# Patient Record
Sex: Male | Born: 2000 | Race: Black or African American | Hispanic: No | Marital: Single | Smoking: Never smoker
Health system: Southern US, Community
[De-identification: ages and names within clinical notes are randomized; demographics above are authoritative.]

## PROBLEM LIST (undated history)

## (undated) ENCOUNTER — Ambulatory Visit: Admission: EM | Payer: BC Managed Care – PPO | Source: Home / Self Care

## (undated) DIAGNOSIS — R569 Unspecified convulsions: Secondary | ICD-10-CM

---

## 2020-09-08 ENCOUNTER — Other Ambulatory Visit: Payer: Self-pay

## 2020-09-08 ENCOUNTER — Encounter (HOSPITAL_COMMUNITY): Payer: Self-pay

## 2020-09-08 ENCOUNTER — Emergency Department (HOSPITAL_COMMUNITY): Payer: BC Managed Care – PPO

## 2020-09-08 ENCOUNTER — Emergency Department (HOSPITAL_COMMUNITY)
Admission: EM | Admit: 2020-09-08 | Discharge: 2020-09-08 | Disposition: A | Discharge status: Home / Self Care | Payer: BC Managed Care – PPO | Attending: Emergency Medicine | Admitting: Emergency Medicine

## 2020-09-08 DIAGNOSIS — R569 Unspecified convulsions: Secondary | ICD-10-CM | POA: Diagnosis not present

## 2020-09-08 LAB — COMPREHENSIVE METABOLIC PANEL
ALT: 18 U/L (ref 0–44)
AST: 19 U/L (ref 15–41)
Albumin: 4.5 g/dL (ref 3.5–5.0)
Alkaline Phosphatase: 34 U/L — ABNORMAL LOW (ref 38–126)
Anion gap: 9 (ref 5–15)
BUN: 20 mg/dL (ref 6–20)
CO2: 24 mmol/L (ref 22–32)
Calcium: 9.5 mg/dL (ref 8.9–10.3)
Chloride: 106 mmol/L (ref 98–111)
Creatinine, Ser: 1.14 mg/dL (ref 0.61–1.24)
GFR, Estimated: >60 mL/min (ref >60)
Glucose, Bld: 93 mg/dL (ref 70–99)
Potassium: 4.5 mmol/L (ref 3.5–5.1)
Sodium: 139 mmol/L (ref 135–145)
Total Bilirubin: 0.7 mg/dL (ref 0.3–1.2)
Total Protein: 7.9 g/dL (ref 6.5–8.1)

## 2020-09-08 LAB — RAPID URINE DRUG SCREEN, HOSP PERFORMED
Amphetamines: NOT DETECTED
Barbiturates: NOT DETECTED
Benzodiazepines: NOT DETECTED
Cocaine: NOT DETECTED
Opiates: NOT DETECTED
Tetrahydrocannabinol: NOT DETECTED

## 2020-09-08 LAB — CBC WITH DIFFERENTIAL/PLATELET
Abs Immature Granulocytes: 0.02 10*3/uL (ref 0.00–0.07)
Basophils Absolute: 0 10*3/uL (ref 0.0–0.1)
Basophils Relative: 0 %
Eosinophils Absolute: 0 10*3/uL (ref 0.0–0.5)
Eosinophils Relative: 0 %
HCT: 45.4 % (ref 39.0–52.0)
Hemoglobin: 15.1 g/dL (ref 13.0–17.0)
Immature Granulocytes: 0 %
Lymphocytes Relative: 10 %
Lymphs Abs: 0.8 10*3/uL (ref 0.7–4.0)
MCH: 27.9 pg (ref 26.0–34.0)
MCHC: 33.3 g/dL (ref 30.0–36.0)
MCV: 83.8 fL (ref 80.0–100.0)
Monocytes Absolute: 0.4 10*3/uL (ref 0.1–1.0)
Monocytes Relative: 5 %
Neutro Abs: 6.8 10*3/uL (ref 1.7–7.7)
Neutrophils Relative %: 85 %
Platelets: 353 10*3/uL (ref 150–400)
RBC: 5.42 MIL/uL (ref 4.22–5.81)
RDW: 13.2 % (ref 11.5–15.5)
WBC: 8.1 10*3/uL (ref 4.0–10.5)
nRBC: 0 % (ref 0.0–0.2)

## 2020-09-08 LAB — URINALYSIS, ROUTINE W REFLEX MICROSCOPIC
Bacteria, UA: NONE SEEN
Bilirubin Urine: NEGATIVE
Glucose, UA: NEGATIVE mg/dL
Ketones, ur: NEGATIVE mg/dL
Nitrite: NEGATIVE
Specific Gravity, Urine: 1.02 (ref 1.005–1.030)
pH: 7 (ref 5.0–8.0)

## 2020-09-08 LAB — ETHANOL: Alcohol, Ethyl (B): <10 mg/dL (ref <10)

## 2020-09-08 NOTE — Discharge Instructions (Addendum)
No driving for next 6 months in light of new onset seizure. No operating heavy machinery. Avoid swimming pools until seizures better managed and evaluated by neurology.

## 2020-09-08 NOTE — ED Provider Notes (Signed)
[facility]-EMERGENCY DEPT Provider Note   CSN: [id number] Arrival date & time: 09/08/20  9030     History Chief Complaint  Patient presents with   Seizure-Like Activity    Clifford Hansen is a 20 y.o. male.  20 year old male with history as below presented the ER for concern for possible seizure-like activity.  This occurred approximately 1 hour prior to arrival.  Normal state of health prior to this event.  Per girlfriend patient with erratic, jerking body movements lasting approximately 20 seconds.  Confused following the event, postictal.  Believes he did bite the cheek on the right side.  No incontinence.  No history of prior seizures.  No daily alcohol use.  No illicit drug use.  No history of seizures.  Patient has returned to his baseline at this time.  No recent dietary or medication changes  The history is provided by the patient. No language interpreter was used.      History reviewed. No pertinent past medical history.  There are no problems to display for this patient.   History reviewed. No pertinent surgical history.     History reviewed. No pertinent family history.     Home Medications Prior to Admission medications   Medication Sig Start Date End Date Taking? Authorizing Provider  sertraline (ZOLOFT) 25 MG tablet Take 25 mg by mouth at bedtime. 08/13/20   [provider]    Allergies    Patient has no known allergies.  Review of Systems   Review of Systems  Constitutional:  Negative for chills and fever.  HENT:  Negative for facial swelling and trouble swallowing.   Eyes:  Negative for photophobia and visual disturbance.  Respiratory:  Negative for cough and shortness of breath.   Cardiovascular:  Negative for chest pain and palpitations.  Gastrointestinal:  Negative for abdominal pain, nausea and vomiting.  Endocrine: Negative for polydipsia and polyuria.  Genitourinary:  Negative for difficulty urinating and hematuria.   Musculoskeletal:  Negative for gait problem and joint swelling.  Skin:  Negative for pallor and rash.  Neurological:  Positive for seizures. Negative for syncope and headaches.  Psychiatric/Behavioral:  Negative for agitation and confusion.    Physical Exam Updated Vital Signs BP 120/67   Pulse 67   Temp 98.3 F (36.8 C) (Oral)   Resp 16   SpO2 97%   Physical Exam Vitals and nursing note reviewed.  Constitutional:      General: He is not in acute distress.    Appearance: He is well-developed.  HENT:     Head: Normocephalic and atraumatic.     Right Ear: External ear normal.     Left Ear: External ear normal.     Mouth/Throat:     Mouth: Mucous membranes are moist.   Eyes:     General: No scleral icterus. Cardiovascular:     Rate and Rhythm: Normal rate and regular rhythm.     Pulses: Normal pulses.     Heart sounds: Normal heart sounds.  Pulmonary:     Effort: Pulmonary effort is normal. No respiratory distress.     Breath sounds: Normal breath sounds.  Abdominal:     General: Abdomen is flat.     Palpations: Abdomen is soft.     Tenderness: There is no abdominal tenderness.  Musculoskeletal:        General: Normal range of motion.     Cervical back: Normal range of motion.     Right lower leg: No edema.  Left lower leg: No edema.  Skin:    General: Skin is warm and dry.     Capillary Refill: Capillary refill takes less than 2 seconds.  Neurological:     Mental Status: He is alert and oriented to person, place, and time.  Psychiatric:        Mood and Affect: Mood normal.        Behavior: Behavior normal.    ED Results / Procedures / Treatments   Labs (all labs ordered are listed, but only abnormal results are displayed) Labs Reviewed  COMPREHENSIVE METABOLIC PANEL - Abnormal; Notable for the following components:      Result Value   Alkaline Phosphatase 34 (*)    All other components within normal limits  URINALYSIS, ROUTINE W REFLEX MICROSCOPIC -  Abnormal; Notable for the following components:   Color, Urine YELLOW (*)    APPearance CLEAR (*)    Hgb urine dipstick TRACE (*)    Protein, ur TRACE (*)    Leukocytes,Ua TRACE (*)    All other components within normal limits  CBC WITH DIFFERENTIAL/PLATELET  ETHANOL  RAPID URINE DRUG SCREEN, HOSP PERFORMED    EKG None  Radiology CT Head Wo Contrast  Result Date: 09/08/2020 CLINICAL DATA:  Seizure, nontraumatic (Age 24-40y) EXAM: CT HEAD WITHOUT CONTRAST TECHNIQUE: Contiguous axial images were obtained from the base of the skull through the vertex without intravenous contrast. COMPARISON:  None. FINDINGS: Brain: No evidence of acute intracranial hemorrhage or extra-axial collection.No evidence of mass lesion/concern mass effect.The ventricles are normal in size. Vascular: No hyperdense vessel or unexpected calcification. Skull: Normal. Negative for fracture or focal lesion. Sinuses/Orbits: Mucous retention cyst in the right sphenoid sinus. Other: None. IMPRESSION: No acute intracranial abnormality. Electronically Signed   By: Caprice Renshaw M.D.   On: 09/08/2020 10:42    Procedures Procedures   Medications Ordered in ED Medications - No data to display  ED Course  I have reviewed the triage vital signs and the nursing notes.  Pertinent labs & imaging results that were available during my care of the patient were reviewed by me and considered in my medical decision making (see chart for details).    MDM Rules/Calculators/A&P                           20 yo male to ED with concern for new onset seizure. One time seizure that resolved spontaneously without intervention. No illicit drug use or etoh, no known provoking factors. He has returned to baseline and neuro exam is non-focal. He is ambulatory. Serious etiology considered  Labs reviewed and are stable. UDS and ETOH neg. UA with some WBC, no bacteria seen. He has no dysuria or urinary symptoms. No concern for STI. Given first time  seizure will obtain [facility] which was reviewed by myself and was negative.   Pt back to baseline. Given he is back to baseline will recommend o/p neuro evaluation prior to starting AED. Advised pt to not drive for 6 mos after seizure, no swimming pools or operating heavy machinery. Ambulatory referral to neurology ordered.  The patient improved significantly and was discharged in stable condition. Detailed discussions were had with the patient regarding current findings, and need for close f/u with PCP or on call doctor. The patient has been instructed to return immediately if the symptoms worsen in any way for re-evaluation. Patient verbalized understanding and is in agreement with current care plan. All questions  answered prior to discharge.     Final Clinical Impression(s) / ED Diagnoses Final diagnoses:  First time seizure [facility])    Rx / DC Orders ED Discharge Orders          Ordered    Ambulatory referral to Neurology       Comments: An appointment is requested in approximately: 1 week   09/08/20 1444             Sloan Leiter, DO 09/08/20 1757

## 2020-09-08 NOTE — ED Triage Notes (Signed)
Pt reports waking up and his friend telling him that she thinks he may have had a seizure in his sleep last night. Pt reports waking up and not recognizing where he was at. Pt denies hx of seizures.

## 2020-09-08 NOTE — ED Provider Notes (Signed)
Emergency Medicine Provider Triage Evaluation Note  Clifford Hansen , a 20 y.o. male  was evaluated in triage.  Pt complains of shaking.  He was staying with a friend in her dorm room when he had a episode lasting about  20 seconds.  He does admit to alcohol use last night.  He says that he was then confused after per his friends report.   He doesn't believe he fell at any point but will confirm with his friend.  Denies drug use.   Review of Systems  Positive: Seizure activity Negative: Fevers  Physical Exam  BP (!) 130/58 (BP Location: Left Arm)   Pulse (!) 118   Temp 98.3 F (36.8 C) (Oral)   Resp 16   SpO2 98%  Gen:   Awake, no distress   Resp:  Normal effort  MSK:   Moves extremities without difficulty  Other:  Awake and alert.    Medical Decision Making  Medically screening exam initiated at 10:18 AM.  Appropriate orders placed.  Clifford Hansen was informed that the remainder of the evaluation will be completed by another provider, this initial triage assessment does not replace that evaluation, and the importance of remaining in the ED until their evaluation is complete.  Possibility of a first time seizure.  Will obtain labs, CT head.     Cristina Gong, PA-C 09/08/20 1023    Sloan Leiter, DO 09/08/20 1757

## 2020-09-10 ENCOUNTER — Encounter: Payer: Self-pay | Admitting: Neurology

## 2020-09-13 ENCOUNTER — Encounter: Payer: Self-pay | Admitting: Neurology

## 2020-09-13 ENCOUNTER — Ambulatory Visit (INDEPENDENT_AMBULATORY_CARE_PROVIDER_SITE_OTHER): Payer: BC Managed Care – PPO | Admitting: Neurology

## 2020-09-13 ENCOUNTER — Other Ambulatory Visit: Payer: Self-pay

## 2020-09-13 VITALS — BP 112/75 | HR 69 | Ht 71.0 in | Wt 205.6 lb

## 2020-09-13 DIAGNOSIS — R569 Unspecified convulsions: Secondary | ICD-10-CM | POA: Diagnosis not present

## 2020-09-13 NOTE — Patient Instructions (Signed)
Good to meet you. ? ?Schedule MRI brain with and without contrast ? ?2. Schedule 1-hour EEG. If normal, we will do a 24-hour EEG ? ?3. Follow-up after tests, call for any changes ? ? ?Seizure Precautions: ?1. If medication has been prescribed for you to prevent seizures, take it exactly as directed.  Do not stop taking the medicine without talking to your doctor first, even if you have not had a seizure in a long time.  ? ?2. Avoid activities in which a seizure would cause danger to yourself or to others.  Don't operate dangerous machinery, swim alone, or climb in high or dangerous places, such as on ladders, roofs, or girders.  Do not drive unless your doctor says you may. ? ?3. If you have any warning that you may have a seizure, lay down in a safe place where you can't hurt yourself.   ? ?4.  No driving for 6 months from last seizure, as per [place] state law.   Please refer to the following link on the Epile[employer] Foundation of America's website for more information: http://www.epilepsyfoundation.org/answerplace/Social/driving/drivingu.cfm  ? ?5.  Maintain good sleep hygiene. Avoid alcohol. ? ?6.  Contact your doctor if you have any problems that may be related to the medicine you are taking. ? ?7.  Call 911 and bring the patient back to the ED if: ?      ? A.  The seizure lasts longer than 5 minutes.      ? B.  The patient doesn't awaken shortly after the seizure ? C.  The patient has new problems such as difficulty seeing, speaking or moving ? D.  The patient was injured during the seizure ? E.  The patient has a temperature over 102 F (39C) ? F.  The patient vomited and now is having trouble breathing ?      ? ?

## 2020-09-13 NOTE — Progress Notes (Signed)
NEUROLOGY CONSULTATION NOTE  Clifford Hansen MRN: [id number] DOB: [birth year]  Referring provider: Dr. Tanda Rockers (ER) Primary care provider: Dr. Maryelizabeth Rowan  Reason for consult:  seizure  Dear Dr Wallace Cullens:  Thank you for your kind referral of Clifford Hansen for consultation of the above symptoms. Although his history is well known to you, please allow me to reiterate it for the purpose of our medical record. He is alone in the office today. Records and images were personally reviewed where available.   HISTORY OF PRESENT ILLNESS: This is a pleasant 20 year old left-handed man with a history of anxiety and depression presenting for evaluation of new onset seizure that occurred on 09/08/20. He recalls waking up around 8:40-8:45am and was still in bed when his girlfriend reported that at 9am he had a blank face then started jerking for 20 seconds. He was confused and could not answer her questions. He recalls her calling an [name] to go the hospital. He bit the sides of both cheeks, no incontinence. His body felt sore for a couple of days. He was back to baseline in the ER. CBC, CMP, EtOH, UDS negative. I personally reviewed head CT without contrast which did not show any acute changes. He has had brief 20-30 minute headaches since the seizure. The day after the seizure he felt lightheaded and dizzy and vomited twice. He vomited again once on Tuesday. He has felt better since then. He denies any recent sleep deprivation. He had a little alcohol the night prior. He has been told that he stares off, "kind of a daydream" since childhood. Over the past year, he has noticed that when he first wakes up, he has a cold chill/body jerks. He denies any gaps in time, olfactory/gustatory hallucinations, deja vu, rising epigastric sensation, focal numbness/tingling/weakness. He denies any diplopia, dysarthria/dysphagia, neck/back pain, bowel/bladder dysfunction. 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 He lives in an apartment. He was recently started on Sertraline 2 weeks ago for depression and anxiety, he has not noticed much improvement yet.   Epilepsy Risk Factors:  He had a minor concussion from football when younger with loss of consciousness, no neurosurgical procedures. Otherwise he had a normal birth and early development.  There is no history of febrile convulsions, CNS infections such as meningitis/encephalitis, significant traumatic brain injury, or family history of seizures.   PAST MEDICAL HISTORY: History reviewed. No pertinent past medical history.  PAST SURGICAL HISTORY: History reviewed. No pertinent surgical history.  MEDICATIONS: Current Outpatient Medications on File Prior to Visit  Medication Sig Dispense Refill   sertraline (ZOLOFT) 25 MG tablet Take 25 mg by mouth at bedtime.     No current facility-administered medications on file prior to visit.    ALLERGIES: No Known Allergies  FAMILY HISTORY: Family History  Problem Relation Age of Onset   Hypertension Mother    Hypertension Maternal Grandmother     SOCIAL HISTORY: Social History   Socioeconomic History   Marital status: Single    Spouse name: Not on file   Number of children: Not on file   Years of education: Not on file   Highest education level: Not on file  Occupational History   Not on file  Tobacco Use   Smoking status: Never   Smokeless tobacco: Never  Vaping Use   Vaping Use: Every day  Substance and Sexual Activity   Alcohol use: Yes   Drug use:  Never   Sexual activity: Not on file  Other Topics Concern   Not on file  Social History Narrative   Left handed    Social Determinants of Health   Financial Resource Strain: Not on file  Food Insecurity: Not on file  Transportation Needs: Not on file  Physical Activity: Not on file  Stress: Not on file  Social Connections: Not on file  Intimate Partner Violence: Not on file      PHYSICAL EXAM: Vitals:   09/13/20 1018  BP: 112/75  Pulse: 69  SpO2: 100%   General: No acute distress Head:  Normocephalic/atraumatic Skin/Extremities: No rash, no edema Neurological Exam: Mental status: alert and oriented to person, place, and time, no dysarthria or aphasia, Fund of knowledge is appropriate.  Recent and remote memory are intact, 3/3 delayed recall.  Attention and concentration are normal, 5/5 WORLD backward. Cranial nerves: CN I: not tested CN II: pupils equal, round and reactive to light, visual fields intact CN III, IV, VI:  full range of motion, no nystagmus, no ptosis CN V: facial sensation intact CN VII: upper and lower face symmetric CN VIII: hearing intact to conversation Bulk & Tone: normal, no fasciculations. Motor: 5/5 throughout with no pronator drift. Sensation: intact to light touch, cold, pin on both UE, decreased cold on left LE, intact pin, vibration sense in both LE.  No extinction to double simultaneous stimulation.  Romberg test negative Deep Tendon Reflexes: +2 throughout Cerebellar: no incoordination on finger to nose testing Gait: narrow-based and steady, able to tandem walk adequately. Tremor: none   IMPRESSION: This is a pleasant 20 year old left-handed man with a history of anxiety, depression, presenting for new onset convulsive seizure on 09/08/2020. He reports staring/daydreaming and possible morning myoclonic jerks. MRI brain with and without contrast and 1-hour EEG will be ordered. If normal, we will do a 24-hour EEG to further evaluate symptoms. We discussed that after an initial seizure, unless there are significant risk factors, an abnormal neurological exam, an EEG showing epileptiform abnormalities, and/or abnormal neuroimaging, treatment with an antiepileptic drug is not indicated.  Patients with a single unprovoked seizure have a recurrence rate of 33% after a single seizure and 73% after a second seizure. We discussed   driving restrictions which indicate a patient needs to free of seizures or events of altered awareness for 6 months prior to resuming driving. We discussed avoidance of seizure triggers, including sleep deprivation and alcohol. Follow-up after tests, he knows to call for any changes.   Thank you for allowing me to participate in the care of this patient. Please do not hesitate to call for any questions or concerns.   Patrcia Dolly, M.D.  CC: Dr. Wallace Cullens, Dr. Duanne Guess

## 2020-09-16 ENCOUNTER — Other Ambulatory Visit: Payer: Self-pay

## 2020-09-16 ENCOUNTER — Ambulatory Visit (INDEPENDENT_AMBULATORY_CARE_PROVIDER_SITE_OTHER): Payer: BC Managed Care – PPO | Admitting: Neurology

## 2020-09-16 DIAGNOSIS — R569 Unspecified convulsions: Secondary | ICD-10-CM | POA: Diagnosis not present

## 2020-09-17 ENCOUNTER — Telehealth: Payer: Self-pay | Admitting: Neurology

## 2020-09-17 DIAGNOSIS — G40309 Generalized idiopathic epilepsy and epileptic syndromes, not intractable, without status epilepticus: Secondary | ICD-10-CM

## 2020-09-17 MED ORDER — LEVETIRACETAM 500 MG PO TABS: 500.0000 mg | ORAL_TABLET | Freq: Two times a day (BID) | ORAL | 11 refills | Status: DC

## 2020-09-17 NOTE — Telephone Encounter (Signed)
Discussed EEG with patient, discussed primary generalized epilepsy and recommendation to start seizure medication. Discussed starting Levetiracetam 500mg  BID, side effects discussed. Proceed with brain MRI, discussed that majority of the time this is normal with primary generalized epilepsy. Discussed avoidance of seizure triggers, including missing medication, alcohol, and sleep deprivation. F/u in 2 months, he knows to call for any changes.   , ok to put on Nov 11 at 2:30pm for f/u, thanks!

## 2020-09-17 NOTE — Procedures (Signed)
ELECTROENCEPHALOGRAM REPORT  Date of Study: 09/16/2020  Patient's Name: Clifford Hansen MRN: [id number] Date of Birth: 16-Oct-2000  Referring Provider: Dr. Patrcia Dolly  Clinical History: This is a 20 year old man with new onset seizure. He also reports staring/daydreaming and possible morning myoclonic jerks  Medications: Zoloft  Technical Summary: A multichannel digital 1-hour EEG recording measured by the international 10-20 system with electrodes applied with paste and impedances below 5000 ohms performed in our laboratory with EKG monitoring in an awake and asleep patient.  Hyperventilation was not performed. Photic stimulation was performed.  The digital EEG was referentially recorded, reformatted, and digitally filtered in a variety of bipolar and referential montages for optimal display.    Description: The patient is awake and asleep during the recording.  During maximal wakefulness, there is a symmetric, medium voltage 10 Hz posterior dominant rhythm that attenuates with eye opening.  The record is symmetric.  During drowsiness and sleep, there is an increase in theta slowing of the background.  Vertex waves and symmetric sleep spindles were seen.  hotic stimulation did not elicit any abnormalities.  There were occasional bursts of generalized irregular high voltage 4-5 Hz spike and polyspike and wave with frontal predominance seen primarily in drowsiness and sleep. No electrographic seizures seen.    EKG lead was unremarkable.  Impression: This 1-hour awake and asleep EEG is abnormal due to the presence of occasional bursts of generalized 4-5 Hz spike and polyspike and wave with frontal predominance.  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 Clinical correlation is advised.   Patrcia Dolly, M.D.

## 2020-09-18 ENCOUNTER — Telehealth: Payer: Self-pay | Admitting: Neurology

## 2020-09-18 NOTE — Telephone Encounter (Signed)
Pt called in stating he has some questions about the results he received the other day, about his diagnosis, and medications.

## 2020-09-18 NOTE — Telephone Encounter (Signed)
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 He also is asking for some resources for epilepsy I gave him the epilepsy website

## 2020-09-19 ENCOUNTER — Other Ambulatory Visit: Payer: BC Managed Care – PPO

## 2020-09-29 ENCOUNTER — Inpatient Hospital Stay: Admission: RE | Admit: 2020-09-29 | Payer: BC Managed Care – PPO | Source: Ambulatory Visit

## 2020-10-01 ENCOUNTER — Ambulatory Visit: Payer: Medicaid Other | Admitting: Neurology

## 2020-10-01 ENCOUNTER — Encounter: Payer: Self-pay | Admitting: Neurology

## 2020-10-01 NOTE — Telephone Encounter (Signed)
Pt called an informed that letter is ready

## 2020-10-01 NOTE — Telephone Encounter (Signed)
Done, thanks

## 2020-11-15 ENCOUNTER — Ambulatory Visit: Payer: BC Managed Care – PPO | Admitting: Neurology

## 2020-12-24 ENCOUNTER — Encounter: Payer: Self-pay | Admitting: Neurology

## 2021-01-13 ENCOUNTER — Telehealth: Payer: Self-pay

## 2021-01-13 NOTE — Telephone Encounter (Signed)
Pt called no answer left a voice mail paperwork he needed was ready and up front for him to pick up

## 2021-01-14 ENCOUNTER — Ambulatory Visit (INDEPENDENT_AMBULATORY_CARE_PROVIDER_SITE_OTHER): Payer: BC Managed Care – PPO | Admitting: Neurology

## 2021-01-14 ENCOUNTER — Encounter: Payer: Self-pay | Admitting: Neurology

## 2021-01-14 ENCOUNTER — Other Ambulatory Visit: Payer: Self-pay

## 2021-01-14 VITALS — BP 123/66 | HR 79 | Ht 70.0 in | Wt 213.8 lb

## 2021-01-14 DIAGNOSIS — G40309 Generalized idiopathic epilepsy and epileptic syndromes, not intractable, without status epilepticus: Secondary | ICD-10-CM | POA: Diagnosis not present

## 2021-01-14 MED ORDER — ZONISAMIDE 100 MG PO CAPS: ORAL_CAPSULE | ORAL | 6 refills | Status: DC

## 2021-01-14 NOTE — Patient Instructions (Signed)
Good to see you.  Start Zonisamide 100mg : Take 1 capsule every night for 2 weeks, then increase to 2 capsules every night for 2 weeks, then increase to 3 capsules every night and continue  2. Follow-up in 3 months, call for any changes   Seizure Precautions: 1. If medication has been prescribed for you to prevent seizures, take it exactly as directed.  Do not stop taking the medicine without talking to your doctor first, even if you have not had a seizure in a long time.   2. Avoid activities in which a seizure would cause danger to yourself or to others.  Don't operate dangerous machinery, swim alone, or climb in high or dangerous places, such as on ladders, roofs, or girders.  Do not drive unless your doctor says you may.  3. If you have any warning that you may have a seizure, lay down in a safe place where you can't hurt yourself.    4.  No driving for 6 months from last seizure, as per [facility].   Please refer to the following link on the Epilepsy Foundation of America's website for more information: http://www.epilepsyfoundation.org/answerplace/Social/driving/drivingu.cfm   5.  Maintain good sleep hygiene. Avoid alcohol.  6.  Contact your doctor if you have any problems that may be related to the medicine you are taking.  7.  Call 911 and bring the patient back to the ED if:        A.  The seizure lasts longer than 5 minutes.       B.  The patient doesn't awaken shortly after the seizure  C.  The patient has new problems such as difficulty seeing, speaking or moving  D.  The patient was injured during the seizure  E.  The patient has a temperature over 102 F (39C)  F.  The patient vomited and now is having trouble breathing

## 2021-01-14 NOTE — Progress Notes (Signed)
NEUROLOGY FOLLOW UP OFFICE NOTE  Clifford Hansen [id number] Jun 14, 2000  HISTORY OF PRESENT ILLNESS: I had the pleasure of seeing Clifford Hansen in follow-up in the neurology clinic on 01/14/2021.  The patient was last seen 4 months ago for new onset seizure that occurred on 09/08/2020. He is alone in the office today. Records and images were personally reviewed where available.  His 1-hour EEG in 09/2020 showed occasional bursts of generalized 4-5 Hz spike and polyspike and wave with frontal predominance. He has not done brain MRI. He was started on Levetiracetam 500mg  BID, however reports today that he stopped the medication at the end of October because it made his very sleepy and nauseated. He denies any seizure or seizure-like symptoms since 09/08/20. He denies any staring/unresponsive episodes, gaps in time, olfactory/gustatory hallucinations, focal numbness/tingling/weakness, myoclonic jerks. He reports headaches are not that frequent. No dizziness, vision changes, no falls. Mood is pretty good, he is sleeping well.   History on Initial Assessment 09/13/2020: This is a pleasant 21 year old left-handed man with a history of anxiety and depression presenting for evaluation of new onset seizure that occurred on 09/08/20. He recalls waking up around 8:40-8:45am and was still in bed when his girlfriend reported that at 9am he had a blank face then started jerking for 20 seconds. He was confused and could not answer her questions. He recalls her calling an 11/08/20 to go the hospital. He bit the sides of both cheeks, no incontinence. His body felt sore for a couple of days. He was back to baseline in the ER. CBC, CMP, EtOH, UDS negative. I personally reviewed head CT without contrast which did not show any acute changes. He has had brief 20-30 minute headaches since the seizure. The day after the seizure he felt lightheaded and dizzy and vomited twice. He vomited again once on Tuesday. He has felt better since then. He  denies any recent sleep deprivation. He had a little alcohol the night prior. He has been told that he stares off, "kind of a daydream" since childhood. Over the past year, he has noticed that when he first wakes up, he has a cold chill/body jerks. He denies any gaps in time, olfactory/gustatory hallucinations, deja vu, rising epigastric sensation, focal numbness/tingling/weakness. He denies any diplopia, dysarthria/dysphagia, neck/back pain, bowel/bladder dysfunction. 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 He lives in an apartment. He was recently started on Sertraline 2 weeks ago for depression and anxiety, he has not noticed much improvement yet.   Epilepsy Risk Factors:  He had a minor concussion from football when younger with loss of consciousness, no neurosurgical procedures. Otherwise he had a normal birth and early development.  There is no history of febrile convulsions, CNS infections such as meningitis/encephalitis, significant traumatic brain injury, or family history of seizures.  PAST MEDICAL HISTORY: History reviewed. No pertinent past medical history.  MEDICATIONS: Current Outpatient Medications on File Prior to Visit  Medication Sig Dispense Refill   levETIRAcetam (KEPPRA) 500 MG tablet Take 1 tablet (500 mg total) by mouth 2 (two) times daily. 60 tablet 11   sertraline (ZOLOFT) 25 MG tablet Take 25 mg by mouth at bedtime.     No current facility-administered medications on file prior to visit.    ALLERGIES: No Known Allergies  FAMILY HISTORY: Family History  Problem Relation Age of Onset   Hypertension Mother    Hypertension Maternal Grandmother  SOCIAL HISTORY: Social History   Socioeconomic History   Marital status: Single    Spouse name: Not on file   Number of children: Not on file   Years of education: Not on file   Highest education level: Not on file  Occupational History   Not on file   Tobacco Use   Smoking status: Never   Smokeless tobacco: Never  Vaping Use   Vaping Use: Some days  Substance and Sexual Activity   Alcohol use: Not Currently   Drug use: Never   Sexual activity: Not on file  Other Topics Concern   Not on file  Social History Narrative   Left handed    Social Determinants of Health   Financial Resource Strain: Not on file  Food Insecurity: Not on file  Transportation Needs: Not on file  Physical Activity: Not on file  Stress: Not on file  Social Connections: Not on file  Intimate Partner Violence: Not on file     PHYSICAL EXAM: Vitals:   01/14/21 1429  BP: 123/66  Pulse: 79  SpO2: 99%   General: No acute distress Head:  Normocephalic/atraumatic Skin/Extremities: No rash, no edema Neurological Exam: alert and awake. No aphasia or dysarthria. Fund of knowledge is appropriate.   Attention and concentration are normal.   Cranial nerves: Pupils equal, round. Extraocular movements intact with no nystagmus. Visual fields full.  No facial asymmetry.  Motor: Bulk and tone normal, muscle strength 5/5 throughout with no pronator drift.   Finger to nose testing intact.  Gait narrow-based and steady, able to tandem walk adequately.  Romberg negative.   IMPRESSION: This is a pleasant 21 yo LH man with a history of anxiety, depression, with new onset seizure on 09/08/2020. EEG consistent with primary generalized epilepsy. He has not done brain MRI, his head CT was normal. He denies any seizures since 09/08/20 and stopped Levetiracetam due to side effects. We discussed diagnosis, prognosis, and management of primary generalized epilepsy. He is agreeable to start a different seizure medication Zonisamide, side effects discussed. Start Zonisamide 100mg  qhs x 2 weeks, then increase to 200mg  qhs x 2 weeks, then increase to 300mg  qhs. We again discussed avoidance of seizure triggers. He is aware of [place] driving laws to stop driving until 6 months seizure-free.  Follow-up in 3 months, call for any changes.    Thank you for allowing me to participate in his care.  Please do not hesitate to call for any questions or concerns.    , M.D.   CC: Dr. 

## 2021-03-31 ENCOUNTER — Telehealth: Payer: Self-pay | Admitting: Neurology

## 2021-03-31 NOTE — Telephone Encounter (Signed)
Form is printed and pt stated that he needs a letter to go with it like the last time,  ?

## 2021-03-31 NOTE — Telephone Encounter (Signed)
This patient called and stated he needs an updated form for [employer] regarding his epilepsy, he said its been about 6 mth since his last one. ?

## 2021-04-01 DIAGNOSIS — J00 Acute nasopharyngitis [common cold]: Secondary | ICD-10-CM | POA: Diagnosis not present

## 2021-04-01 DIAGNOSIS — J302 Other seasonal allergic rhinitis: Secondary | ICD-10-CM | POA: Diagnosis not present

## 2021-04-04 NOTE — Telephone Encounter (Signed)
For his form, I need to confirm, how is he taking the Zonisamide started in January? Thanks ?

## 2021-04-04 NOTE — Telephone Encounter (Signed)
Pt called no answer left a voice mail to call the office back we need to know how is he taking the Zonisamide started in January ?

## 2021-04-07 NOTE — Telephone Encounter (Signed)
Pt called no answer left a voice mail to call the office back we need to know how is he taking the Zonisamide started in January ?

## 2021-04-08 NOTE — Telephone Encounter (Signed)
Pt called no answer left a voice mail to call the office back we need to know how is he taking the Zonisamide started in January ?

## 2021-04-16 ENCOUNTER — Telehealth: Payer: Self-pay | Admitting: Neurology

## 2021-04-16 NOTE — Telephone Encounter (Signed)
Called patient and informed him that Dr. Karel Jarvis and Herbert Seta are out of office until Monday and that I will send them this message. Patient wanted to know if the forms were started? I looked at the past note and informed patient that Herbert Seta called him a few times and needed to know if he was taking the Zonisamide started in January? Patient stated yes he is. He also states that he is "pretty sure" that he told Herbert Seta that.  ? ? ?

## 2021-04-16 NOTE — Telephone Encounter (Signed)
Patient is following up on the forms aquino was supposed to complete. Michela Pitcher he has not received a call for it ?

## 2021-04-23 ENCOUNTER — Telehealth: Payer: Self-pay | Admitting: Neurology

## 2021-04-23 NOTE — Telephone Encounter (Signed)
Patient is inquiring about the form that Clifford Hansen was supposed to be filling out. One was filled out in jan 2023 he needs another one done ?

## 2021-04-24 NOTE — Telephone Encounter (Signed)
See other phone note , done

## 2021-04-24 NOTE — Telephone Encounter (Signed)
Done, thanks

## 2021-04-24 NOTE — Telephone Encounter (Signed)
Called patient and informed him that his form has been completed. Patient stated that he will come and pick it up. Patient provided office hours and had no further question or concerns. ? ?Form has been placed up front in folder for pick up.  ?

## 2021-04-30 ENCOUNTER — Ambulatory Visit: Payer: BC Managed Care – PPO | Admitting: Neurology

## 2021-05-22 ENCOUNTER — Telehealth: Payer: Self-pay | Admitting: Neurology

## 2021-05-22 NOTE — Telephone Encounter (Signed)
Pt called and copy of paperwork is up front for him to pick up

## 2021-05-22 NOTE — Telephone Encounter (Signed)
Patient needs another copy of the form aquino signed for him for the [employer]

## 2021-12-06 DIAGNOSIS — R030 Elevated blood-pressure reading, without diagnosis of hypertension: Secondary | ICD-10-CM | POA: Diagnosis not present

## 2021-12-06 DIAGNOSIS — R569 Unspecified convulsions: Secondary | ICD-10-CM | POA: Diagnosis not present

## 2021-12-06 DIAGNOSIS — I10 Essential (primary) hypertension: Secondary | ICD-10-CM | POA: Diagnosis not present

## 2021-12-06 DIAGNOSIS — G40909 Epilepsy, unspecified, not intractable, without status epilepticus: Secondary | ICD-10-CM | POA: Diagnosis not present

## 2021-12-06 DIAGNOSIS — R519 Headache, unspecified: Secondary | ICD-10-CM | POA: Diagnosis not present

## 2021-12-06 DIAGNOSIS — R29818 Other symptoms and signs involving the nervous system: Secondary | ICD-10-CM | POA: Diagnosis not present

## 2021-12-06 DIAGNOSIS — R42 Dizziness and giddiness: Secondary | ICD-10-CM | POA: Diagnosis not present

## 2021-12-10 ENCOUNTER — Telehealth: Payer: Self-pay | Admitting: Neurology

## 2021-12-10 MED ORDER — ZONISAMIDE 100 MG PO CAPS: ORAL_CAPSULE | ORAL | 5 refills | Status: DC

## 2021-12-10 NOTE — Telephone Encounter (Signed)
Pt called informed that the main trigger for seizures is missing medication, as well as sleep deprivation and alcohol. make sure to take the Zonisamide and we will send refills until his appt in June. Pt verbalized understanding

## 2021-12-10 NOTE — Telephone Encounter (Signed)
Pt called in stating he had another seizure on 12/06/21 and was taken to the hospital. He stated he was not taking his zonisamide. He was prescribed it again through the hospital.

## 2021-12-10 NOTE — Telephone Encounter (Signed)
Patient has not been seen in almost a year. Pls remind him the main trigger for seizures is missing medication, as well as sleep deprivation and alcohol. Pls make sure he knows to take the Zonisamide and send refills until his appt in June, thanks

## 2021-12-10 NOTE — Addendum Note (Signed)
Addended by: Dimas Chyle on: 12/10/2021 12:01 PM   Modules accepted: Orders

## 2022-03-23 ENCOUNTER — Telehealth: Payer: Self-pay | Admitting: Neurology

## 2022-03-23 NOTE — Telephone Encounter (Signed)
Patient needs a Medical Disposition Form filled out and needs a letter stating any physical limitations he has and if Dr. Delice Lesch feels the pt is still fit for duty with the [employer]. I have put the Medical Disposition Form in Dr. Amparo Bristol box

## 2022-03-23 NOTE — Telephone Encounter (Signed)
I have not seen him since January 2023, he needs an appt first before we can fill out forms. Ok to put on waitlist for earlier appt. Thanks

## 2022-03-24 ENCOUNTER — Ambulatory Visit: Payer: BC Managed Care – PPO | Admitting: Neurology

## 2022-03-24 NOTE — Telephone Encounter (Signed)
Pt is coming in today for a follow up at 1:30

## 2022-04-15 ENCOUNTER — Encounter (HOSPITAL_BASED_OUTPATIENT_CLINIC_OR_DEPARTMENT_OTHER): Payer: Self-pay | Admitting: Emergency Medicine

## 2022-04-15 ENCOUNTER — Other Ambulatory Visit: Payer: Self-pay

## 2022-04-15 ENCOUNTER — Emergency Department (HOSPITAL_BASED_OUTPATIENT_CLINIC_OR_DEPARTMENT_OTHER)
Admission: EM | Admit: 2022-04-15 | Discharge: 2022-04-15 | Disposition: A | Discharge status: Home / Self Care | Payer: BC Managed Care – PPO | Attending: Emergency Medicine | Admitting: Emergency Medicine

## 2022-04-15 DIAGNOSIS — R569 Unspecified convulsions: Secondary | ICD-10-CM | POA: Insufficient documentation

## 2022-04-15 HISTORY — DX: Unspecified convulsions: R56.9

## 2022-04-15 MED ORDER — ZONISAMIDE 100 MG PO CAPS: ORAL_CAPSULE | ORAL | 5 refills | Status: DC

## 2022-04-15 NOTE — ED Provider Notes (Signed)
[place] [facility]PART[facility] AT [facility] Provider Note   CSN: [id number] Arrival date & time: 04/15/22  1723     History  Chief Complaint  Patient presents with   Seizures    Clifford Hansen is a 22 y.o. male.  Patient with history of seizures.  Here after episode of breakthrough seizure today.  Did not hit his head.  He is not having any pain.  He supposed to go to [employer] this weekend and needs a note saying he can participate.  He has not been compliant with his seizure meds.  He supposed to follow-up with neurology in June.  Is been a while since he has been consistently taking his meds.  Denies any nausea vomiting diarrhea.  Nothing makes it worse or better.  Only 1 episode today and he had 1 seizure 5 months ago otherwise has been well-controlled.  The history is provided by the patient.       Home Medications Prior to Admission medications   Medication Sig Start Date End Date Taking? Authorizing Provider  sertraline (ZOLOFT) 25 MG tablet Take 25 mg by mouth at bedtime. 08/13/20   [provider]  zonisamide (ZONEGRAN) 100 MG capsule Take 100 mg at night for 2 weeks, take 200 mg at night for 2 weeks, take 300 mg nightly thereafter 04/15/22   Virgina Norfolk, DO      Allergies    Patient has no known allergies.    Review of Systems   Review of Systems  Physical Exam Updated Vital Signs BP (!) 140/79 (BP Location: Right Arm)   Pulse (!) 57   Temp 98.2 F (36.8 C)   Resp 20   Ht 5\' 11"  (1.803 m)   Wt 113.4 kg   SpO2 99%   BMI 34.87 kg/m  Physical Exam Vitals and nursing note reviewed.  Constitutional:      General: He is not in acute distress.    Appearance: He is well-developed.  HENT:     Head: Normocephalic and atraumatic.  Eyes:     Extraocular Movements: Extraocular movements intact.     Conjunctiva/sclera: Conjunctivae normal.     Pupils: Pupils are equal, round, and reactive to light.  Cardiovascular:     Rate and Rhythm:  Normal rate and regular rhythm.     Pulses: Normal pulses.     Heart sounds: Normal heart sounds. No murmur heard. Pulmonary:     Effort: Pulmonary effort is normal. No respiratory distress.     Breath sounds: Normal breath sounds.  Abdominal:     Palpations: Abdomen is soft.     Tenderness: There is no abdominal tenderness.  Musculoskeletal:        General: No swelling.     Cervical back: Neck supple.  Skin:    General: Skin is warm and dry.     Capillary Refill: Capillary refill takes less than 2 seconds.  Neurological:     General: No focal deficit present.     Mental Status: He is alert and oriented to person, place, and time.     Cranial Nerves: No cranial nerve deficit.     Sensory: No sensory deficit.     Motor: No weakness.     Coordination: Coordination normal.     Comments: 5+ out of 5 strength throughout, normal sensation, no drift, normal finger-nose-finger, normal speech  Psychiatric:        Mood and Affect: Mood normal.     ED Results /  Procedures / Treatments   Labs (all labs ordered are listed, but only abnormal results are displayed) Labs Reviewed - No data to display  EKG None  Radiology No results found.  Procedures Procedures    Medications Ordered in ED Medications - No data to display  ED Course/ Medical Decision Making/ A&P                             Medical Decision Making  [name] is here after breakthrough seizures.  History of seizures.  Noncompliance.  He is only had 2 seizure episodes now in the last 5 months however.  Will restart him on his seizure medications.  He actually has been in touch with his neurologist and he has have an appointment in June.  He needs a excuse note for [employer] this weekend.  Overall he appears well.  He is neurologically intact.  He is at his baseline.  Have no concern for acute process.  Discharged in good condition.  Seizure precautions given.  This chart was dictated using voice recognition  software.  Despite best efforts to proofread,  errors can occur which can change the documentation meaning.         Final Clinical Impression(s) / ED Diagnoses Final diagnoses:  Seizure-like activity    Rx / DC Orders ED Discharge Orders          Ordered    zonisamide (ZONEGRAN) 100 MG capsule        04/15/22 1739              Chayce Rullo, DO 04/15/22 1739

## 2022-04-15 NOTE — ED Triage Notes (Signed)
Pt sts he had a seizure an hour ago; sts he is here because he wants "documentation for the [employer]"

## 2022-05-28 ENCOUNTER — Ambulatory Visit (INDEPENDENT_AMBULATORY_CARE_PROVIDER_SITE_OTHER): Payer: BC Managed Care – PPO | Admitting: Neurology

## 2022-05-28 ENCOUNTER — Encounter: Payer: Self-pay | Admitting: Neurology

## 2022-05-28 VITALS — BP 135/70 | HR 69 | Resp 16 | Ht 71.0 in | Wt 252.0 lb

## 2022-05-28 DIAGNOSIS — G40309 Generalized idiopathic epilepsy and epileptic syndromes, not intractable, without status epilepticus: Secondary | ICD-10-CM | POA: Diagnosis not present

## 2022-05-28 MED ORDER — ZONISAMIDE 100 MG PO CAPS: ORAL_CAPSULE | ORAL | 3 refills | Status: DC

## 2022-05-28 NOTE — Progress Notes (Signed)
NEUROLOGY FOLLOW UP OFFICE NOTE  Clifford Hansen [id number] 09-20-2000  HISTORY OF PRESENT ILLNESS: I had the pleasure of seeing Clifford Hansen in follow-up in the neurology clinic on 05/28/2022.  The patient was last seen over a year ago for primary generalized epilepsy. His 1-hour EEG in 09/2020 showed occasional bursts of generalized 4-5 Hz spike and polyspike and wave with frontal predominance. He has not done brain MRI. Since his last visit, he was in the ER on 12/06/21 for seizure. He was awake and recalls feeling weak followed by a salty taste in his mouth. His friends reported he convulsed for 30 seconds. He reported stopping the Zonisamide a few months prior. He had another seizure in the ER and was given Versed. He was in the ER for another seizure on 04/15/22 also in the setting of medication noncompliance. Seizure occurred in his sleep, he bit his tongue and reportedly had shaking for 30 seconds. He was also sleep deprived. No focal weakness. He denies any alcohol use. He denies any staring/unresponsive episodes, myoclonic jerks. He has minor headaches and migraines every now and then. No associated nausea/vomiting. Headache responds to Tylenol. No dizziness, vision changes, no falls. He has been taking the Zonisamide 300mg  qhs since last seizure. No falls. He sleep 5-6 hours. He lives with his girlfriend.    History on Initial Assessment 09/13/2020: This is a pleasant 22 year old left-handed man with a history of anxiety and depression presenting for evaluation of new onset seizure that occurred on 09/08/20. He recalls waking up around 8:40-8:45am and was still in bed when his girlfriend reported that at 9am he had a blank face then started jerking for 20 seconds. He was confused and could not answer her questions. He recalls her calling an [name] to go the hospital. He bit the sides of both cheeks, no incontinence. His body felt sore for a couple of days. He was back to baseline in the ER. CBC, CMP, EtOH,  UDS negative. I personally reviewed head CT without contrast which did not show any acute changes. He has had brief 20-30 minute headaches since the seizure. The day after the seizure he felt lightheaded and dizzy and vomited twice. He vomited again once on Tuesday. He has felt better since then. He denies any recent sleep deprivation. He had a little alcohol the night prior. He has been told that he stares off, "kind of a daydream" since childhood. Over the past year, he has noticed that when he first wakes up, he has a cold chill/body jerks. He denies any gaps in time, olfactory/gustatory hallucinations, deja vu, rising epigastric sensation, focal numbness/tingling/weakness. He denies any diplopia, dysarthria/dysphagia, neck/back pain, bowel/bladder dysfunction. 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 He lives in an apartment. He was recently started on Sertraline 2 weeks ago for depression and anxiety, he has not noticed much improvement yet.   Epilepsy Risk Factors:  He had a minor concussion from football when younger with loss of consciousness, no neurosurgical procedures. Otherwise he had a normal birth and early development.  There is no history of febrile convulsions, CNS infections such as meningitis/encephalitis, significant traumatic brain injury, or family history of seizures.  PAST MEDICAL HISTORY: Past Medical History:  Diagnosis Date   Seizures (HCC)     MEDICATIONS: Current Outpatient Medications on File Prior to Visit  Medication Sig Dispense Refill   sertraline (ZOLOFT) 25 MG tablet Take 25 mg by  mouth at bedtime.     zonisamide (ZONEGRAN) 100 MG capsule Take 100 mg at night for 2 weeks, take 200 mg at night for 2 weeks, take 300 mg nightly thereafter 90 capsule 5   No current facility-administered medications on file prior to visit.    ALLERGIES: No Known Allergies  FAMILY HISTORY: Family History  Problem Relation  Age of Onset   Hypertension Mother    Hypertension Maternal Grandmother     SOCIAL HISTORY: Social History   Socioeconomic History   Marital status: Single    Spouse name: Not on file   Number of children: Not on file   Years of education: Not on file   Highest education level: Not on file  Occupational History   Not on file  Tobacco Use   Smoking status: Never   Smokeless tobacco: Never  Vaping Use   Vaping Use: Former  Substance and Sexual Activity   Alcohol use: Not Currently   Drug use: Never   Sexual activity: Not on file  Other Topics Concern   Not on file  Social History Narrative   Are you right handed or left handed? Left handed    Are you currently employed ? 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 no   Who lives with you? Girlfriend and patient   What type of home do you live in: 1 story or 2 story? Apartment 1st floor       Social Determinants of Health   Financial Resource Strain: Not on file  Food Insecurity: Not on file  Transportation Needs: Not on file  Physical Activity: Not on file  Stress: Not on file  Social Connections: Not on file  Intimate Partner Violence: Not on file     PHYSICAL EXAM: Vitals:   05/28/22 1118  BP: 135/70  Pulse: 69  Resp: 16  SpO2: 98%   General: No acute distress Head:  Normocephalic/atraumatic Skin/Extremities: No rash, no edema Neurological Exam: alert and awake. No aphasia or dysarthria. Fund of knowledge is appropriate.  Attention and concentration are normal.   Cranial nerves: Pupils equal, round. Extraocular movements intact with no nystagmus. Visual fields full.  No facial asymmetry.  Motor: Bulk and tone normal, muscle strength 5/5 throughout with no pronator drift.   Finger to nose testing intact.  Gait narrow-based and steady, able to tandem walk adequately.  Romberg negative.   IMPRESSION: This is a pleasant 22 yo LH man with a history of anxiety, depression,  with Primary Generalized Epilepsy. He has had seizures in the setting of medication noncompliance. We discussed the importance of avoiding seizure triggers, including missing medication, sleep deprivation. We discussed diagnosis and prognosis with Primary Generalized Epilepsy. Continue Zonisamide 300mg  qhs. He is aware of [place] driving laws to stop driving after a seizure until 6 months seizure-free. Follow-up in 5-6 months, call for any changes.   Thank you for allowing me to participate in his care.  Please do not hesitate to call for any questions or concerns.    Patrcia Dolly, M.D.

## 2022-05-28 NOTE — Patient Instructions (Signed)
Good to see you. Continue Zonisamide 100mg : Take 3 capsules every night. Follow-up in 5-6 months, call for any changes   Seizure Precautions: 1. If medication has been prescribed for you to prevent seizures, take it exactly as directed.  Do not stop taking the medicine without talking to your doctor first, even if you have not had a seizure in a long time.   2. Avoid activities in which a seizure would cause danger to yourself or to others.  Don't operate dangerous machinery, swim alone, or climb in high or dangerous places, such as on ladders, roofs, or girders.  Do not drive unless your doctor says you may.  3. If you have any warning that you may have a seizure, lay down in a safe place where you can't hurt yourself.    4.  No driving for 6 months from last seizure, as per [facility].   Please refer to the following link on the Epilepsy Foundation of America's website for more information: http://www.epilepsyfoundation.org/answerplace/Social/driving/drivingu.cfm   5.  Maintain good sleep hygiene. Avoid alcohol.  6.  Contact your doctor if you have any problems that may be related to the medicine you are taking.  7.  Call 911 and bring the patient back to the ED if:        A.  The seizure lasts longer than 5 minutes.       B.  The patient doesn't awaken shortly after the seizure  C.  The patient has new problems such as difficulty seeing, speaking or moving  D.  The patient was injured during the seizure  E.  The patient has a temperature over 102 F (39C)  F.  The patient vomited and now is having trouble breathing

## 2022-06-26 ENCOUNTER — Ambulatory Visit: Payer: BC Managed Care – PPO | Admitting: Neurology

## 2022-07-22 DIAGNOSIS — G4700 Insomnia, unspecified: Secondary | ICD-10-CM | POA: Diagnosis not present

## 2022-07-22 DIAGNOSIS — F331 Major depressive disorder, recurrent, moderate: Secondary | ICD-10-CM | POA: Diagnosis not present

## 2022-07-22 DIAGNOSIS — R41840 Attention and concentration deficit: Secondary | ICD-10-CM | POA: Diagnosis not present

## 2022-07-22 DIAGNOSIS — F411 Generalized anxiety disorder: Secondary | ICD-10-CM | POA: Diagnosis not present

## 2022-08-18 DIAGNOSIS — Z13220 Encounter for screening for lipoid disorders: Secondary | ICD-10-CM | POA: Diagnosis not present

## 2022-08-18 DIAGNOSIS — Z114 Encounter for screening for human immunodeficiency virus [HIV]: Secondary | ICD-10-CM | POA: Diagnosis not present

## 2022-08-18 DIAGNOSIS — Z0000 Encounter for general adult medical examination without abnormal findings: Secondary | ICD-10-CM | POA: Diagnosis not present

## 2022-11-11 ENCOUNTER — Ambulatory Visit: Payer: BC Managed Care – PPO | Admitting: Neurology

## 2023-01-20 IMAGING — CT CT HEAD W/O CM
3 series · 15 of 47 positions shown, 18 images · non-contrast
Comparison: None.

CLINICAL DATA: Seizure, nontraumatic (Age 18-40y)

EXAM:
CT HEAD WITHOUT CONTRAST
TECHNIQUE: Contiguous axial images were obtained from the base of the skull
through the vertex without intravenous contrast.

[Series 2: head wo · axial · 0.47mm/px · z∈[-135,+15]mm · 9 of 36 slices shown, 12 images]
[im 3/36  brain]
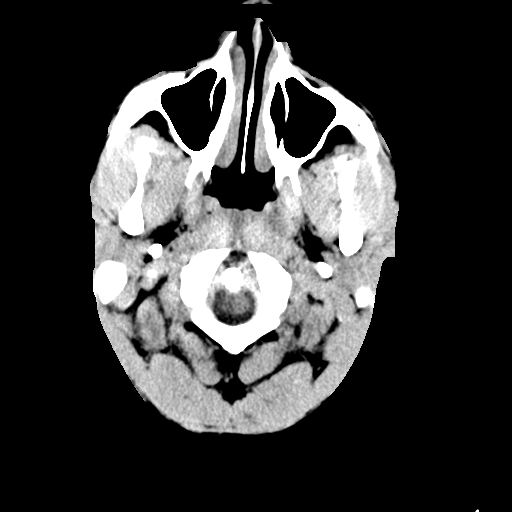
[im 3/36  bone]
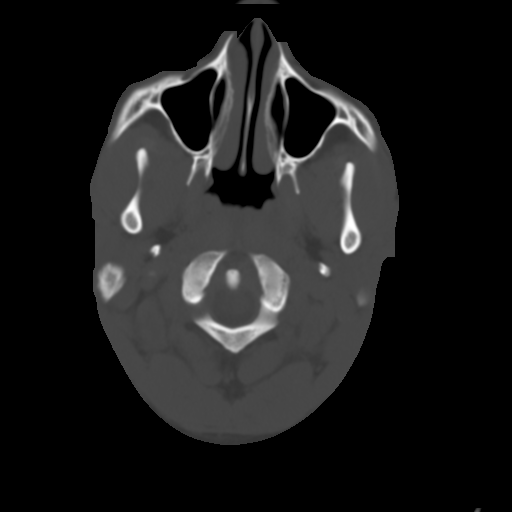
[im 7/36  brain]
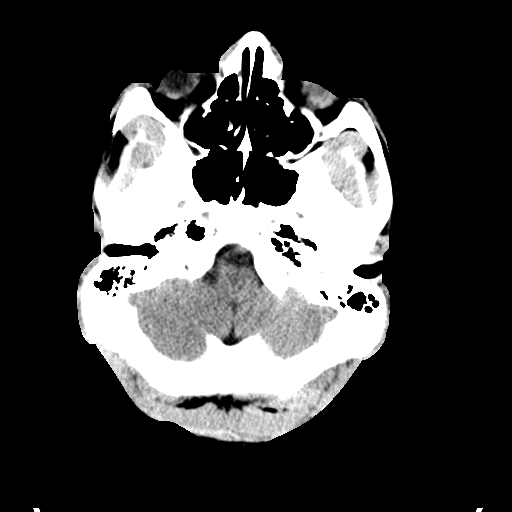
[im 10/36  brain]
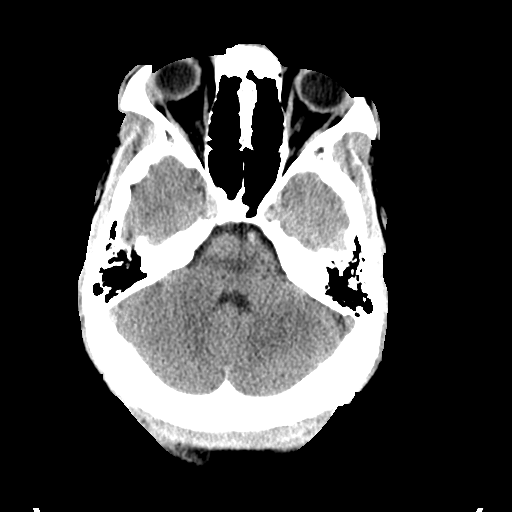
[im 14/36  brain]
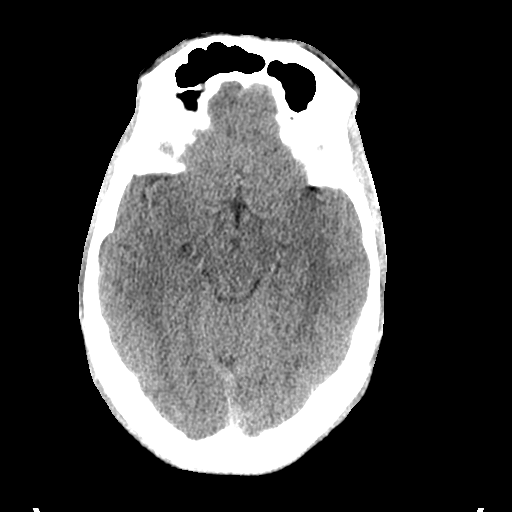
[im 19/36  brain]
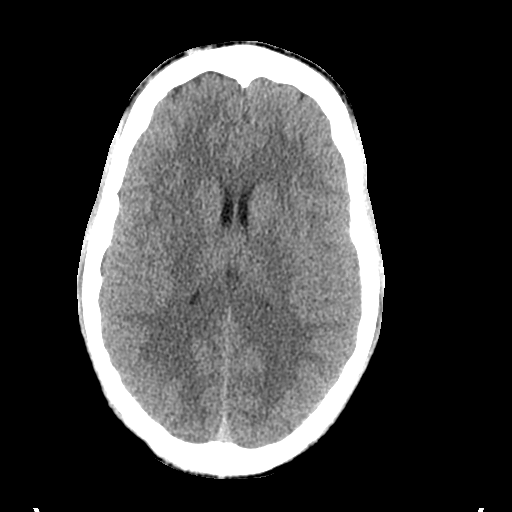
[im 19/36  bone]
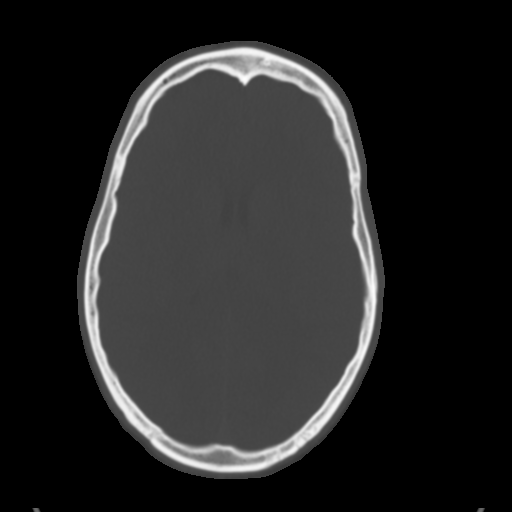
[im 22/36  brain]
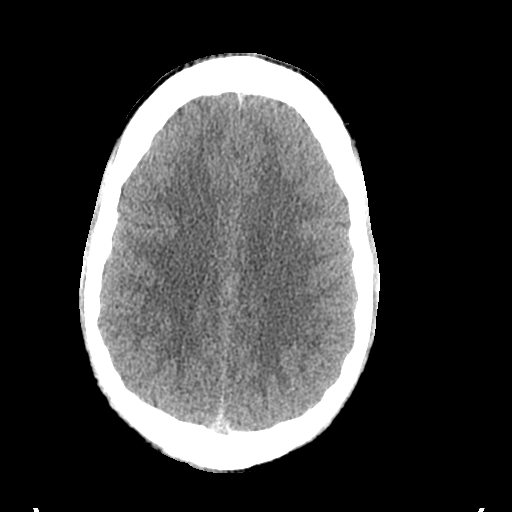
[im 26/36  brain]
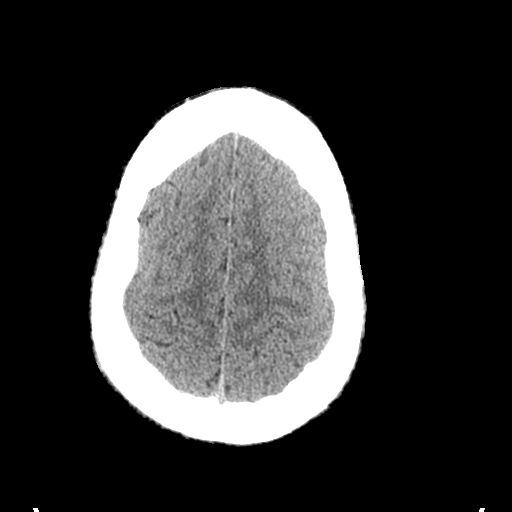
[im 29/36  brain]
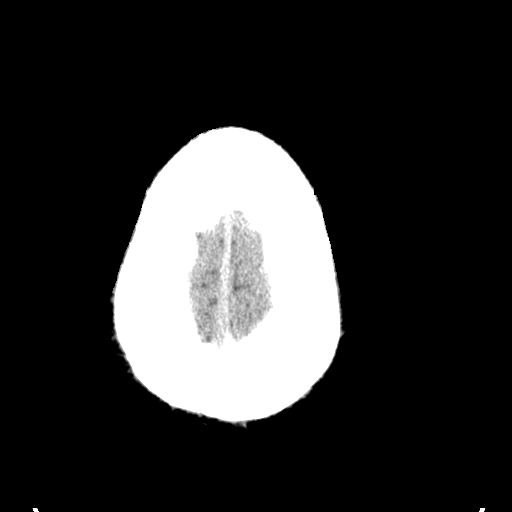
[im 33/36  brain]
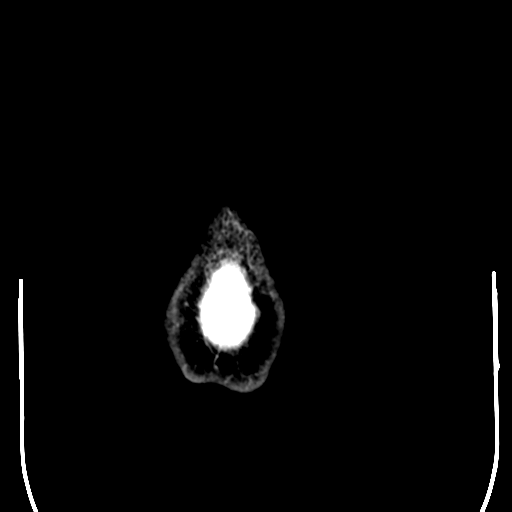
[im 33/36  bone]
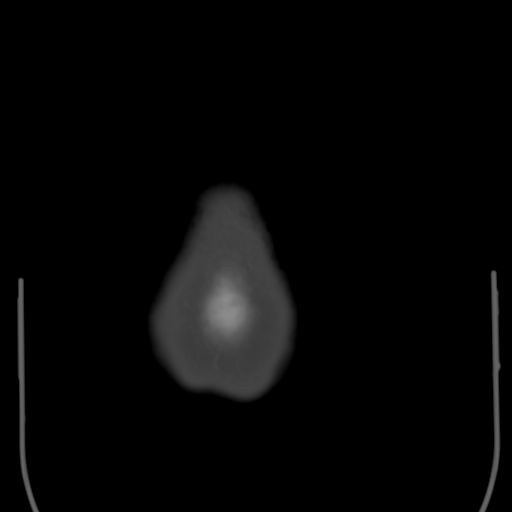

[Series 5: coronal soft tissue · coronal · 0.35mm/px · 3 of 75 slices shown]
[im 25/75  brain]
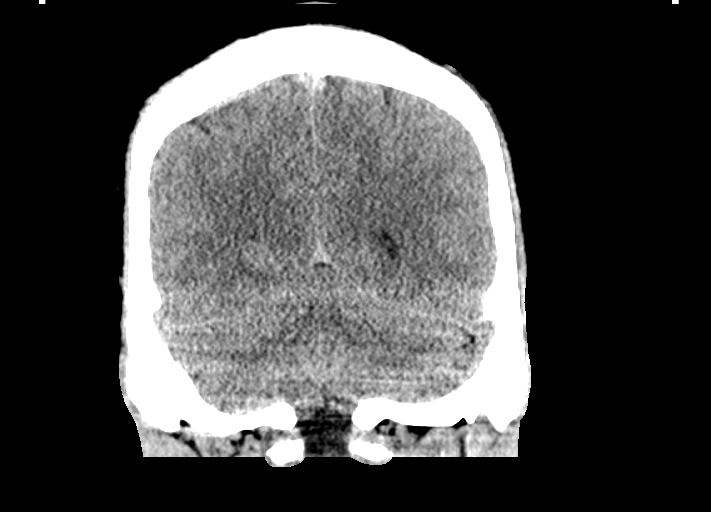
[im 33/75  brain]
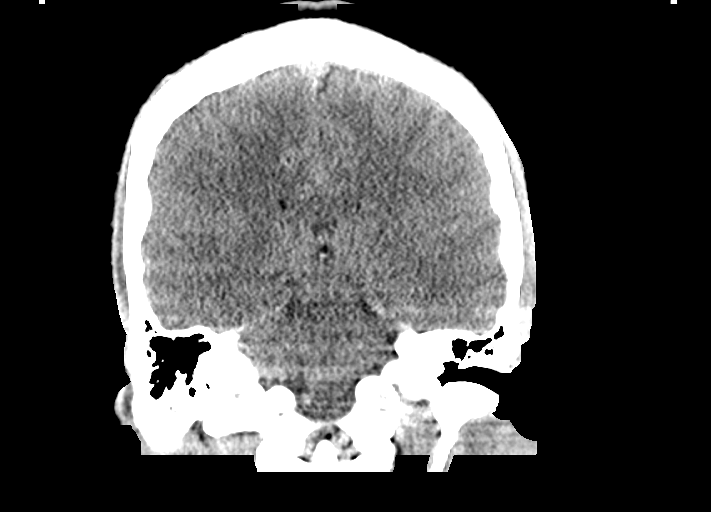
[im 42/75  brain]
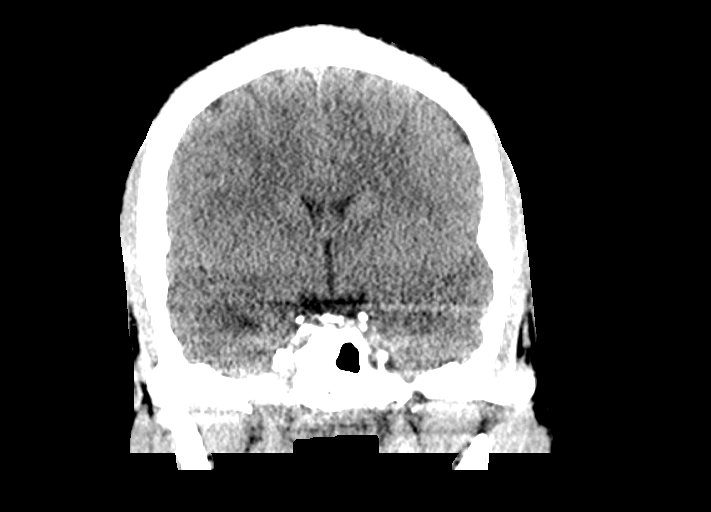

[Series 6: sagittal soft tissue · sagittal · 0.35mm/px · 3 of 59 slices shown]
[im 20/59  brain]
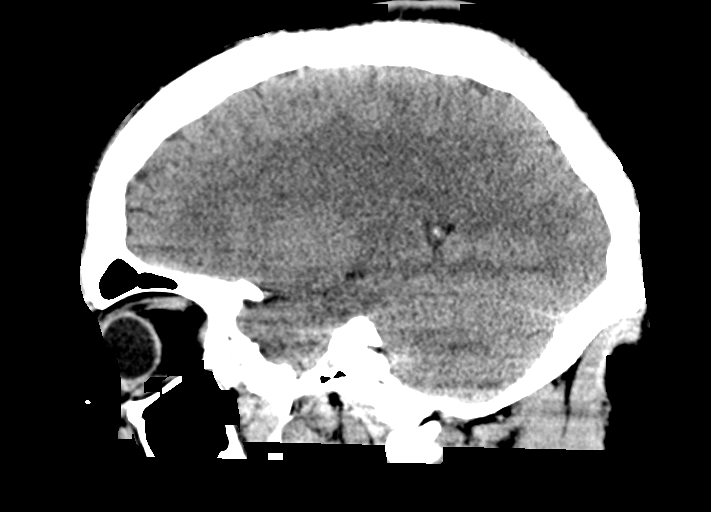
[im 30/59  brain]
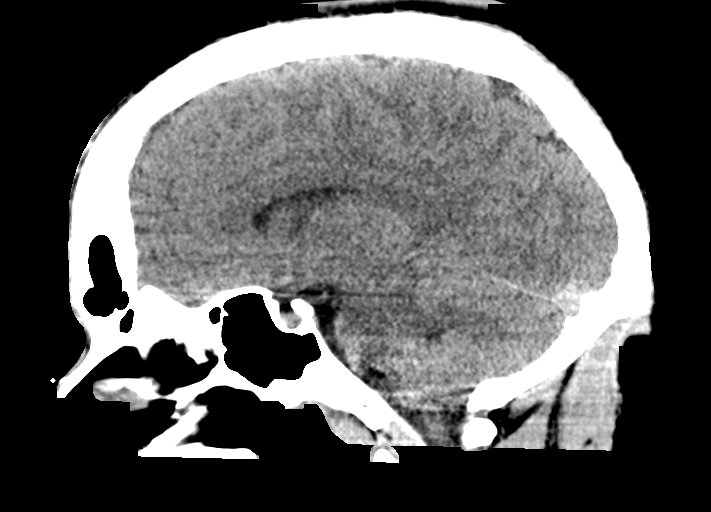
[im 39/59  brain]
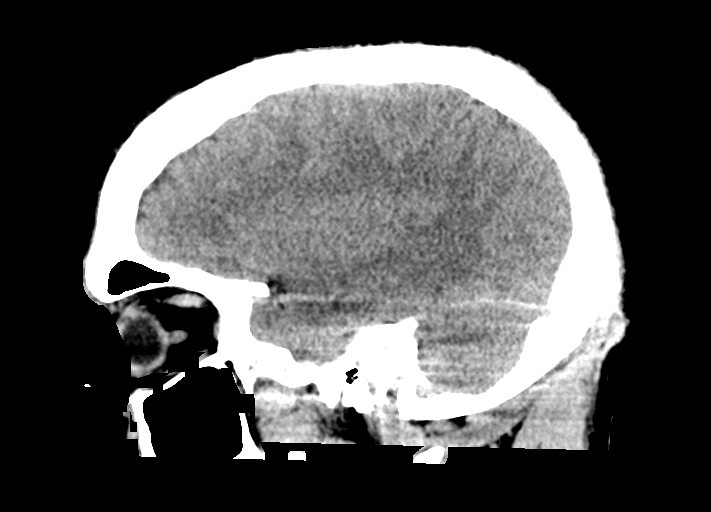

[15 of 47 positions shown; findings below may reference images not displayed]

FINDINGS: Brain: No evidence of acute intracranial hemorrhage or extra-axial
collection.No evidence of mass lesion/concern mass effect.The
ventricles are normal in size.

Vascular: No hyperdense vessel or unexpected calcification.

Skull: Normal. Negative for fracture or focal lesion.

Sinuses/Orbits: Mucous retention cyst in the right sphenoid sinus.

Other: None.
IMPRESSION: No acute intracranial abnormality.

## 2023-03-25 ENCOUNTER — Telehealth: Payer: Self-pay | Admitting: Neurology

## 2023-03-25 DIAGNOSIS — G40309 Generalized idiopathic epilepsy and epileptic syndromes, not intractable, without status epilepticus: Secondary | ICD-10-CM

## 2023-03-25 MED ORDER — ZONISAMIDE 100 MG PO CAPS: ORAL_CAPSULE | ORAL | 3 refills | Status: DC

## 2023-03-25 NOTE — Addendum Note (Signed)
 Addended by: Dimas Chyle on: 03/25/2023 01:50 PM   Modules accepted: Orders

## 2023-03-25 NOTE — Telephone Encounter (Signed)
 Pt last seizure was in April 2024, pt is going to come tomorrow to have blood work done he wanted you to know its going to be low, he has not been taken it right [place]'t take his medication. Pt is going today to pick up RX from pharmacy,

## 2023-03-25 NOTE — Telephone Encounter (Signed)
 Submitted DMV Paper work for completion and to be faxed as well as a copy at the front, will pay once completed. Forms are in Aquino's mailbox

## 2023-03-25 NOTE — Telephone Encounter (Signed)
 Pls confirm that last seizure was in April 2024. The DMV will want his Zonisamide blood level, pls order and once back we can fill out forms. I won't be seeing him until Oct, I sent refills until his next appt. Thanks

## 2023-03-29 ENCOUNTER — Other Ambulatory Visit

## 2023-03-29 DIAGNOSIS — G40309 Generalized idiopathic epilepsy and epileptic syndromes, not intractable, without status epilepticus: Secondary | ICD-10-CM | POA: Diagnosis not present

## 2023-04-02 LAB — ZONISAMIDE LEVEL: Zonisamide: 6.7 ug/mL — ABNORMAL LOW (ref 10.0–40.0)

## 2023-04-06 DIAGNOSIS — G40309 Generalized idiopathic epilepsy and epileptic syndromes, not intractable, without status epilepticus: Secondary | ICD-10-CM | POA: Diagnosis not present

## 2023-04-06 DIAGNOSIS — F331 Major depressive disorder, recurrent, moderate: Secondary | ICD-10-CM | POA: Diagnosis not present

## 2023-04-06 DIAGNOSIS — F411 Generalized anxiety disorder: Secondary | ICD-10-CM | POA: Diagnosis not present

## 2023-04-06 DIAGNOSIS — E559 Vitamin D deficiency, unspecified: Secondary | ICD-10-CM | POA: Diagnosis not present

## 2023-04-06 DIAGNOSIS — G4700 Insomnia, unspecified: Secondary | ICD-10-CM | POA: Diagnosis not present

## 2023-04-06 DIAGNOSIS — R5383 Other fatigue: Secondary | ICD-10-CM | POA: Diagnosis not present

## 2023-04-09 ENCOUNTER — Telehealth: Payer: Self-pay

## 2023-04-09 DIAGNOSIS — G40309 Generalized idiopathic epilepsy and epileptic syndromes, not intractable, without status epilepticus: Secondary | ICD-10-CM
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
## 2023-04-13 NOTE — Telephone Encounter (Signed)
 Pls let him know we can repeat the EEG, however the last time EEG was done with flashing lights, there were no changes with the lights. 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 His Zonisamide level was low. How regular was he taking the medication before blood draw? If he was taking it regularly, I would like to increase the dose to 4 capsules every night. Thanks

## 2023-04-14 MED ORDER — ZONISAMIDE 100 MG PO CAPS: ORAL_CAPSULE | ORAL | 3 refills | Status: AC

## 2023-04-14 NOTE — Telephone Encounter (Signed)
 Pt stated that he had been taken his zonosamide for about a week regularly before he had his labwork done, he is agreeable to increase it to 4 tablets at night, he would still like to have the eeg done, order placed in epic,

## 2023-04-14 NOTE — Telephone Encounter (Signed)
 Rx sent for Zonisamide 100mg : Take 4 caps at bedtime. Thanks

## 2023-04-26 ENCOUNTER — Telehealth: Payer: Self-pay | Admitting: Neurology

## 2023-04-26 NOTE — Telephone Encounter (Signed)
 Pt called he has his medication. He was informed that we will send his his paperwork when Dr Ty Gales returns

## 2023-04-26 NOTE — Telephone Encounter (Signed)
 Pt called AN and LM needing a refill. When I called pt , he said he got a refill from the pharmacy, there was a mix up.  Pt is following up with paper work. He brought paper work last month from the [facility] and has not heard from anyone.

## 2023-05-12 ENCOUNTER — Telehealth: Payer: Self-pay | Admitting: Neurology

## 2023-05-12 DIAGNOSIS — F411 Generalized anxiety disorder: Secondary | ICD-10-CM | POA: Diagnosis not present

## 2023-05-12 DIAGNOSIS — Z0000 Encounter for general adult medical examination without abnormal findings: Secondary | ICD-10-CM | POA: Diagnosis not present

## 2023-05-12 DIAGNOSIS — G4700 Insomnia, unspecified: Secondary | ICD-10-CM | POA: Diagnosis not present

## 2023-05-12 DIAGNOSIS — F331 Major depressive disorder, recurrent, moderate: Secondary | ICD-10-CM | POA: Diagnosis not present

## 2023-05-12 DIAGNOSIS — E559 Vitamin D deficiency, unspecified: Secondary | ICD-10-CM | POA: Diagnosis not present

## 2023-05-12 DIAGNOSIS — Z13220 Encounter for screening for lipoid disorders: Secondary | ICD-10-CM | POA: Diagnosis not present

## 2023-05-12 NOTE — Telephone Encounter (Signed)
 Pt called in to get an update on the [facility] paperwork he brought in back in March.

## 2023-05-13 NOTE — Telephone Encounter (Signed)
 I called and spoke with the patient. He will give us  a call back to pay the form fee.

## 2023-05-18 DIAGNOSIS — Z0000 Encounter for general adult medical examination without abnormal findings: Secondary | ICD-10-CM | POA: Diagnosis not present

## 2023-05-18 DIAGNOSIS — Z23 Encounter for immunization: Secondary | ICD-10-CM | POA: Diagnosis not present

## 2023-05-18 DIAGNOSIS — R7989 Other specified abnormal findings of blood chemistry: Secondary | ICD-10-CM | POA: Diagnosis not present

## 2023-05-19 DIAGNOSIS — Z0279 Encounter for issue of other medical certificate: Secondary | ICD-10-CM

## 2023-06-11 ENCOUNTER — Telehealth: Payer: Self-pay | Admitting: Neurology

## 2023-06-11 NOTE — Telephone Encounter (Signed)
Paperwork has been faxed to the [facility]

## 2023-06-11 NOTE — Telephone Encounter (Signed)
 Patient called and asked if we could fax the [facility] forms to the [facility]

## 2023-09-07 ENCOUNTER — Ambulatory Visit
Admission: RE | Admit: 2023-09-07 | Discharge: 2023-09-07 | Disposition: A | Discharge status: Home / Self Care | Source: Ambulatory Visit | Attending: Family Medicine | Admitting: Family Medicine

## 2023-09-07 ENCOUNTER — Other Ambulatory Visit: Payer: Self-pay | Admitting: Family Medicine

## 2023-09-07 DIAGNOSIS — Z021 Encounter for pre-employment examination: Secondary | ICD-10-CM

## 2023-09-14 ENCOUNTER — Telehealth: Payer: Self-pay | Admitting: Neurology

## 2023-09-14 NOTE — Telephone Encounter (Signed)
 Pt brought in a letter to be signed. Letter is in the Box.

## 2023-09-15 NOTE — Telephone Encounter (Signed)
 Spoke to pt-below message and agreed to see Dr. Georjean  next month appt.,first before completing the form. Pt requesting to add on waiting list if possible.

## 2023-09-15 NOTE — Telephone Encounter (Signed)
 LVM--to call the office back.

## 2023-09-15 NOTE — Telephone Encounter (Signed)
 Pls let patient know he has not been seen since 05/2022 and he should be taking seizure medication but the form says he is not taking medication. We will need to discuss on his appointment. I cannot fill out form without evaluation first. Thanks

## 2023-10-20 ENCOUNTER — Ambulatory Visit: Payer: Self-pay | Admitting: Neurology

## 2023-11-29 ENCOUNTER — Encounter: Payer: Self-pay | Admitting: Neurology

## 2023-12-31 ENCOUNTER — Ambulatory Visit: Payer: Self-pay

## 2024-02-10 ENCOUNTER — Telehealth: Payer: Self-pay | Admitting: Neurology

## 2024-02-10 NOTE — Telephone Encounter (Signed)
 Pt is needing DMV Paper work re-done, [employer] never rcvd paperwork. Pt said you can download off the web and would need it faxed ASAP, any questions call Pt

## 2024-02-11 NOTE — Telephone Encounter (Signed)
 The reason is he has not been seen since May 2024. He will need a f/u before we can fill up paperwork again. Will put on waitlist for earlier appt

## 2024-02-28 ENCOUNTER — Ambulatory Visit: Payer: Self-pay | Admitting: Neurology

## 2024-05-15 ENCOUNTER — Ambulatory Visit: Payer: Self-pay | Admitting: Neurology
